# Patient Record
Sex: Male | Born: 1965 | Race: White | Hispanic: No | Marital: Married | Smoking: Never smoker
Health system: Southern US, Community
[De-identification: ages and names within clinical notes are randomized; demographics above are authoritative.]

## PROBLEM LIST (undated history)

## (undated) DIAGNOSIS — Z789 Other specified health status: Secondary | ICD-10-CM

## (undated) HISTORY — PX: CYSTOSCOPY PROSTATE W/ LASER: SUR372

---

## 2010-02-10 ENCOUNTER — Ambulatory Visit: Payer: Self-pay | Admitting: Urology

## 2010-03-07 ENCOUNTER — Ambulatory Visit: Payer: Self-pay | Admitting: Urology

## 2011-08-31 IMAGING — US US PELVIS LIMITED
1 series · 17 of 25 positions shown · non-contrast
Comparison: None

REASON FOR EXAM: Testicular Cyst
COMMENTS:

PROCEDURE:     US  - US TESTICULAR  - February 10, 2010 [DATE]
RESULT:     Indication: Testicular cyst
TECHNIQUE: Multiple gray-scale, color-flow Doppler, and spectral waveform
tracings of the testicles and testicular vasculature are presented for
review.

[Series 1: us pelvis limited · 17 of 32 slices shown]
[im 1/32]
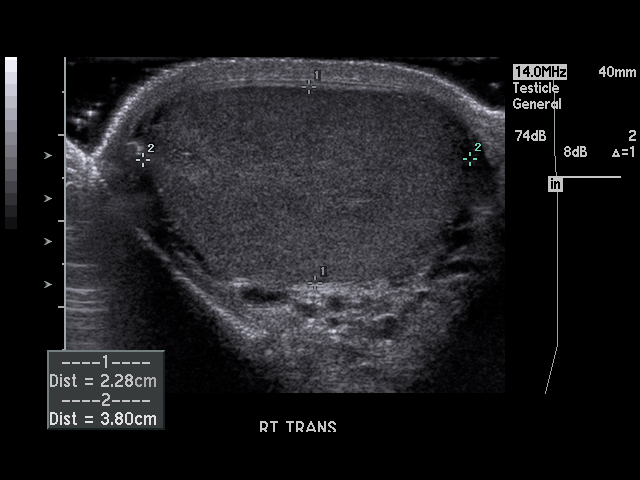
[im 3/32]
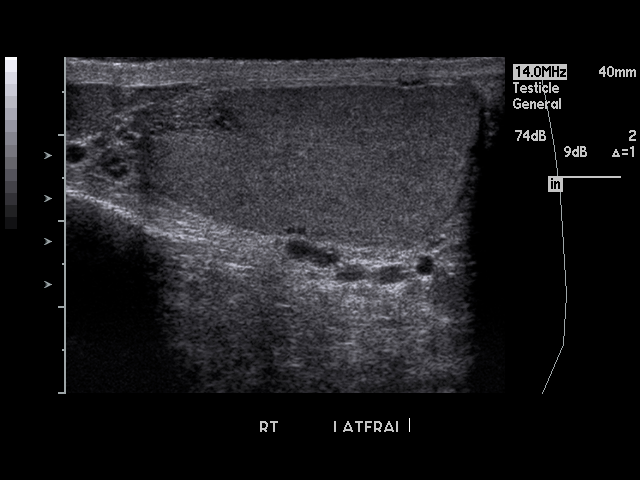
[im 4/32]
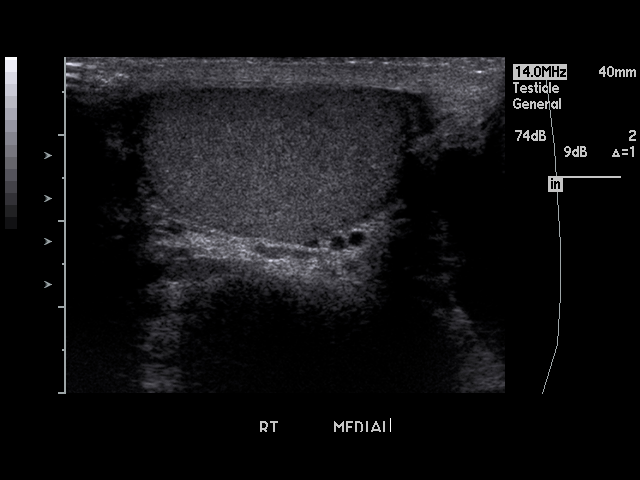
[im 7/32]
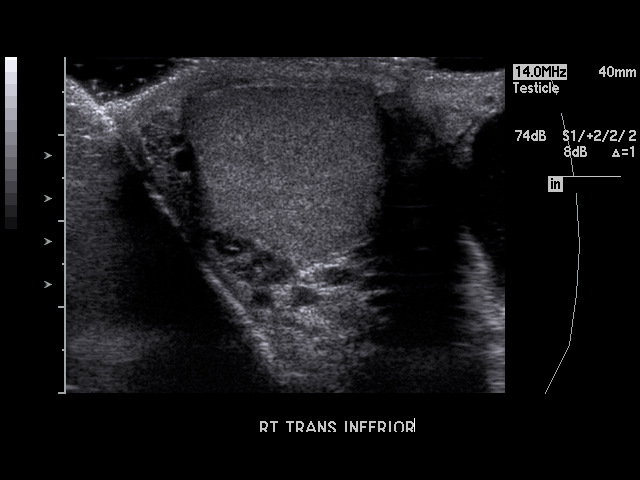
[im 8/32]
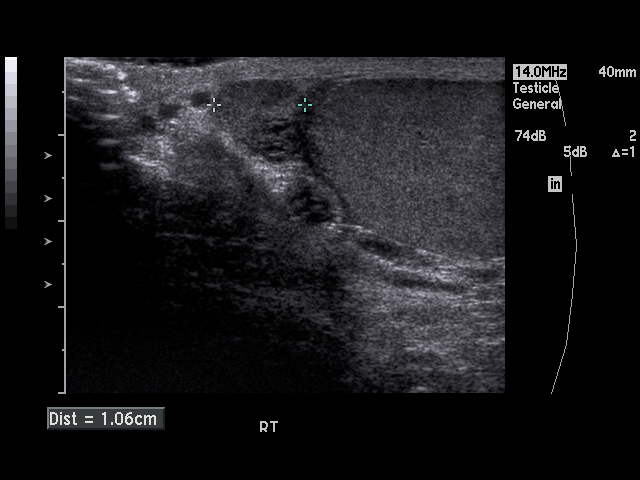
[im 11/32]
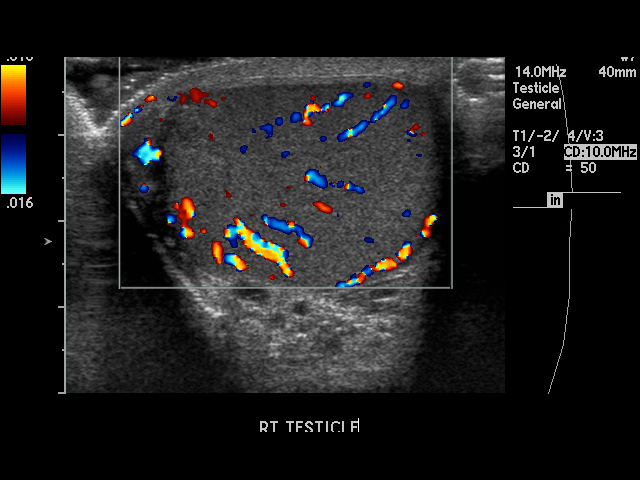
[im 12/32]
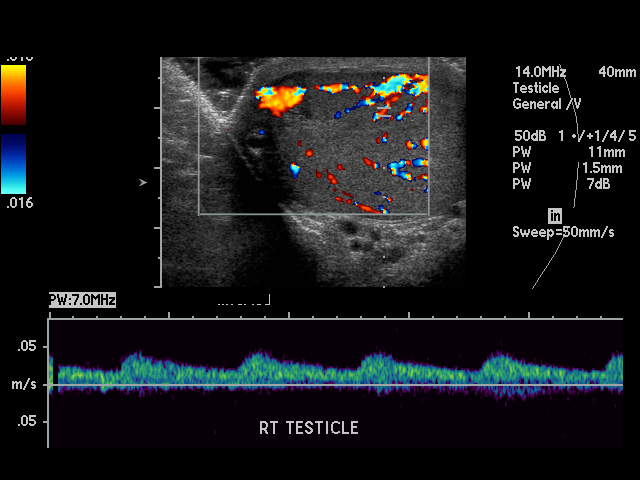
[im 15/32]
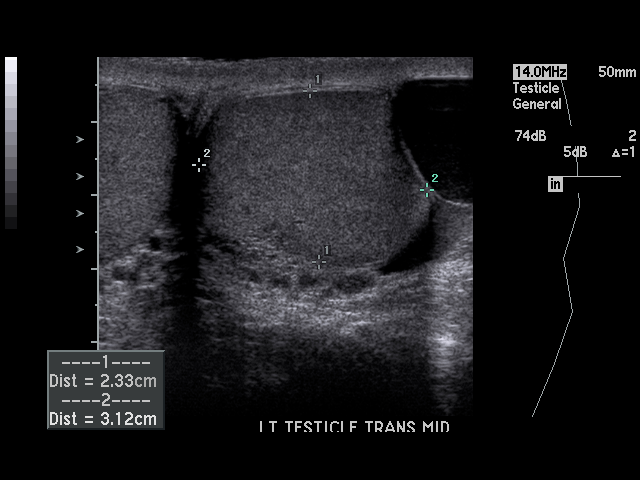
[im 16/32]
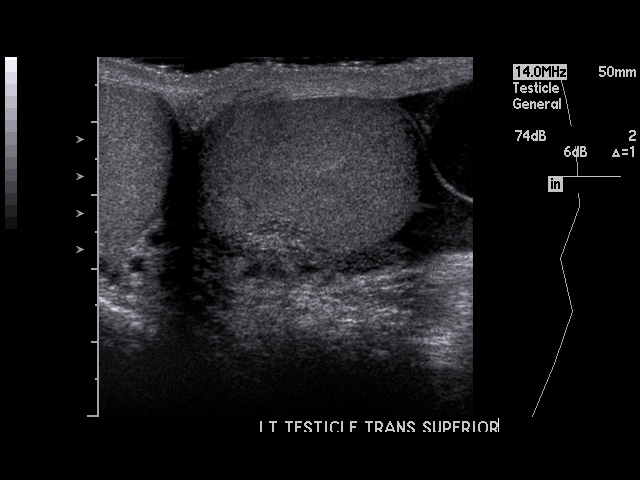
[im 17/32]
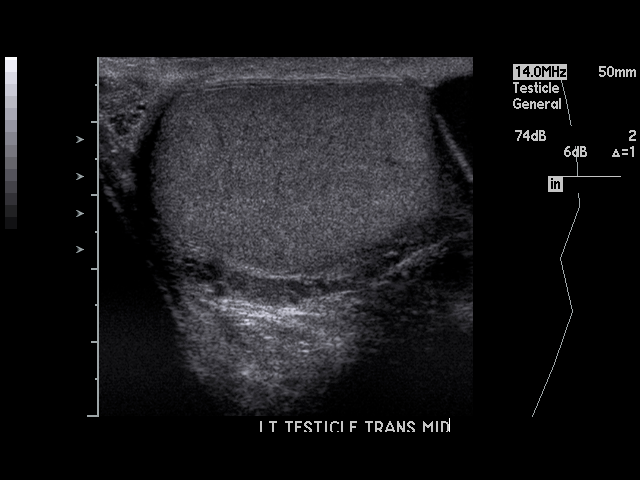
[im 20/32]
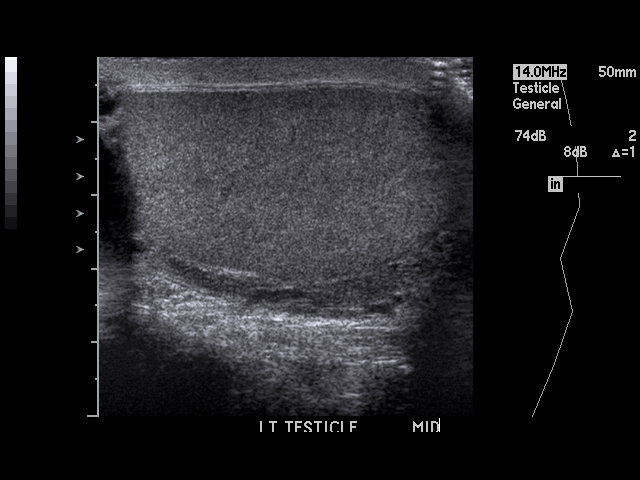
[im 21/32]
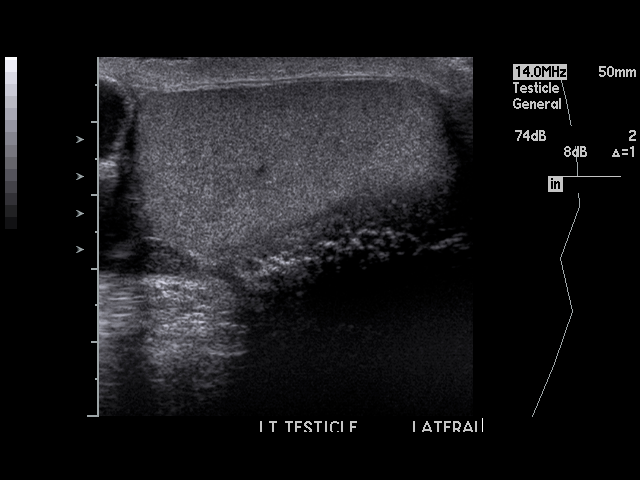
[im 24/32]
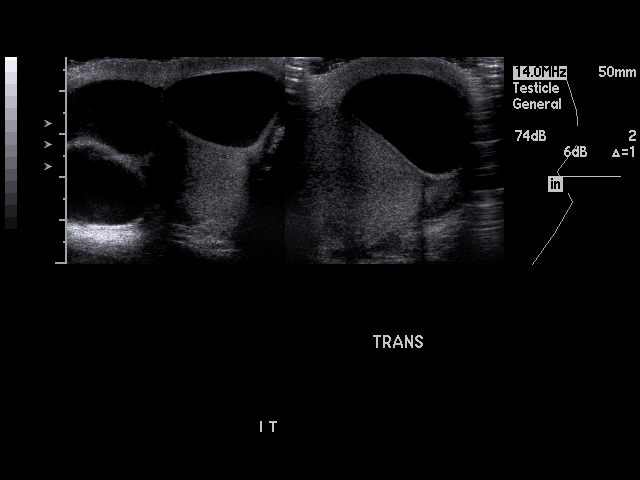
[im 25/32]
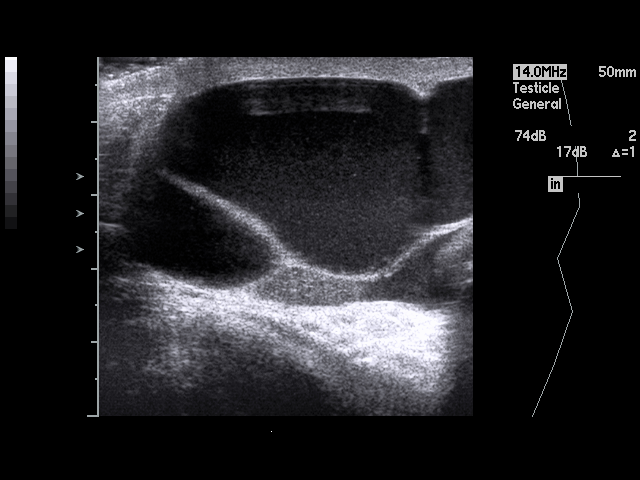
[im 28/32]
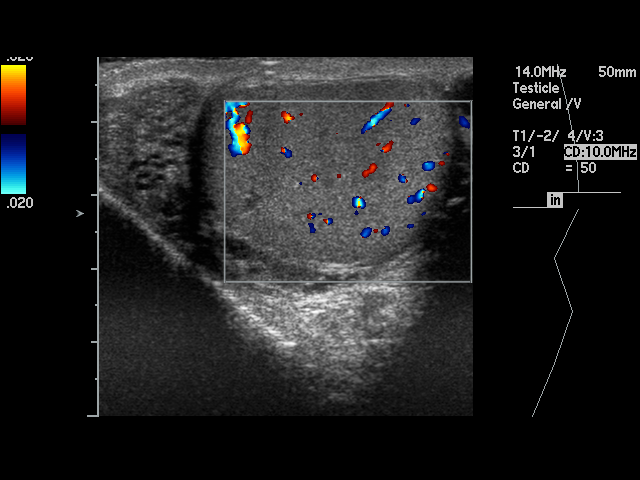
[im 29/32]
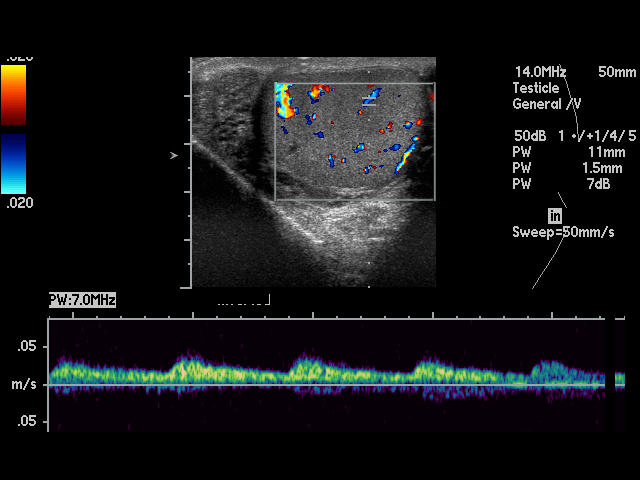
[im 32/32]
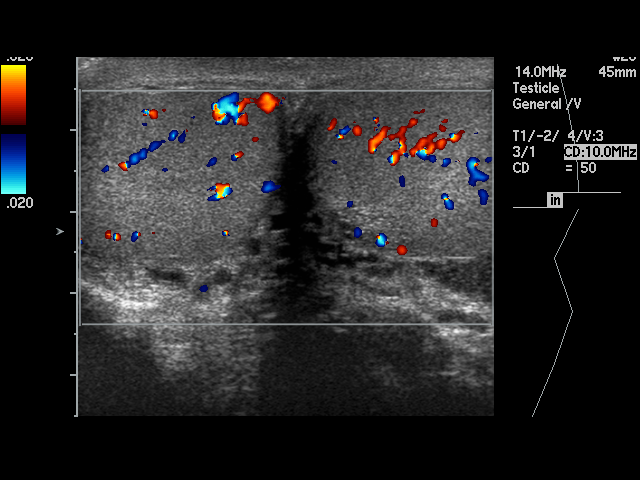

[17 of 25 positions shown; findings below may reference images not displayed]

FINDINGS: The right testicle is of normal echogenicity and measures 2.3 x 3.8 x
cm. No focal mass lesions. There is normal arterial and venous blood flow.
The right epididymal head measures 1.1 cm and is normal in appearance. No
right varicocele or significant hydrocele.

The left testicle is of normal echogenicity and measures 4.3 x 2.3 x 3.1 cm.
No focal mass lesions. There is normal arterial and venous blood flow. There
are 3 anechoic mass in the region of the left epididymis which may represent
epididymal cyst versus a loculated hydrocele. The cysts measure 2.7 x 2.4 x
3 cm, 4.8 x 3.1 x 3.6 cm and 2.6 x 3.8 x 1.9 cm. There are a few internal
echoes within this is likely representing the degree. No left varicocele or
significant hydrocele.
IMPRESSION: 1. There are 3 anechoic mass in the region of the left epididymis which may
represent epididymal cyst versus a loculated hydrocele.

2. There is no evidence of testicular torsion.

## 2017-03-12 ENCOUNTER — Telehealth: Payer: Self-pay | Admitting: Gastroenterology

## 2017-03-12 NOTE — Telephone Encounter (Signed)
Patient needs his colonoscopy done before Dec. 9th.

## 2017-03-14 ENCOUNTER — Telehealth: Payer: Self-pay

## 2017-03-14 ENCOUNTER — Other Ambulatory Visit: Payer: Self-pay

## 2017-03-14 DIAGNOSIS — Z1211 Encounter for screening for malignant neoplasm of colon: Secondary | ICD-10-CM

## 2017-03-14 MED ORDER — NA SULFATE-K SULFATE-MG SULF 17.5-3.13-1.6 GM/177ML PO SOLN: 1.0000 | ORAL | 0 refills | Status: AC

## 2017-03-14 NOTE — Telephone Encounter (Signed)
Pt scheduled for a screening colonoscopy at [facility] with [place] on 03/19/17.

## 2017-03-14 NOTE — Telephone Encounter (Signed)
Gastroenterology Pre-Procedure Review  Request Date:  Requesting Physician: Dr.   PATIENT REVIEW QUESTIONS: The patient responded to the following health history questions as indicated:    1. Are you having any GI issues? no 2. Do you have a personal history of Polyps? no 3. Do you have a family history of Colon Cancer or Polyps? no 4. Diabetes Mellitus? no 5. Joint replacements in the past 12 months?no 6. Major health problems in the past 3 months?no 7. Any artificial heart valves, MVP, or defibrillator?no    MEDICATIONS & ALLERGIES:    Patient reports the following regarding taking any anticoagulation/antiplatelet therapy:   Plavix, Coumadin, Eliquis, Xarelto, Lovenox, Pradaxa, Brilinta, or Effient? no Aspirin? no  Patient confirms/reports the following medications:  No current outpatient medications on file.   No current facility-administered medications for this visit.     Patient confirms/reports the following allergies:  Allergies not on file  No orders of the defined types were placed in this encounter.   AUTHORIZATION INFORMATION Primary Insurance: 1D#: Group #:  Secondary Insurance: 1D#: Group #:  SCHEDULE INFORMATION: Date: 03/19/17 Time: Location: [facility]

## 2017-03-19 ENCOUNTER — Encounter
Admission: RE | Disposition: A | Discharge status: Home / Self Care | Payer: Self-pay | Source: Ambulatory Visit | Attending: Gastroenterology

## 2017-03-19 ENCOUNTER — Ambulatory Visit: Payer: BLUE CROSS/BLUE SHIELD | Admitting: Certified Registered Nurse Anesthetist

## 2017-03-19 ENCOUNTER — Ambulatory Visit
Admission: RE | Admit: 2017-03-19 | Discharge: 2017-03-19 | Disposition: A | Discharge status: Home / Self Care | Payer: BLUE CROSS/BLUE SHIELD | Source: Ambulatory Visit | Attending: Gastroenterology | Admitting: Gastroenterology

## 2017-03-19 ENCOUNTER — Encounter: Payer: Self-pay | Admitting: *Deleted

## 2017-03-19 DIAGNOSIS — D125 Benign neoplasm of sigmoid colon: Secondary | ICD-10-CM | POA: Diagnosis not present

## 2017-03-19 DIAGNOSIS — K635 Polyp of colon: Secondary | ICD-10-CM

## 2017-03-19 DIAGNOSIS — K640 First degree hemorrhoids: Secondary | ICD-10-CM | POA: Diagnosis not present

## 2017-03-19 DIAGNOSIS — Z1211 Encounter for screening for malignant neoplasm of colon: Secondary | ICD-10-CM | POA: Diagnosis present

## 2017-03-19 HISTORY — DX: Other specified health status: Z78.9

## 2017-03-19 HISTORY — PX: COLONOSCOPY WITH PROPOFOL: SHX5780

## 2017-03-19 SURGERY — COLONOSCOPY WITH PROPOFOL
Anesthesia: General

## 2017-03-19 MED ORDER — PROPOFOL 500 MG/50ML IV EMUL
INTRAVENOUS | Status: DC | PRN
  Administered 2017-03-19: 150 ug/kg/min via INTRAVENOUS

## 2017-03-19 MED ORDER — LIDOCAINE HCL (PF) 2 % IJ SOLN
INTRAMUSCULAR | Status: AC
  Filled 2017-03-19: qty 10

## 2017-03-19 MED ORDER — SODIUM CHLORIDE 0.9 % IV SOLN
INTRAVENOUS | Status: DC
  Administered 2017-03-19: 1000 mL via INTRAVENOUS

## 2017-03-19 MED ORDER — LIDOCAINE HCL (CARDIAC) 20 MG/ML IV SOLN
INTRAVENOUS | Status: DC | PRN
  Administered 2017-03-19: 50 mg via INTRAVENOUS

## 2017-03-19 MED ORDER — PROPOFOL 10 MG/ML IV BOLUS
INTRAVENOUS | Status: DC | PRN
  Administered 2017-03-19 (×2): 40 mg via INTRAVENOUS
  Administered 2017-03-19: 50 mg via INTRAVENOUS

## 2017-03-19 MED ORDER — PROPOFOL 500 MG/50ML IV EMUL
INTRAVENOUS | Status: AC
  Filled 2017-03-19: qty 50

## 2017-03-19 NOTE — Anesthesia Procedure Notes (Signed)
Performed by: Johnna Acosta, CRNA Pre-anesthesia Checklist: Patient identified, Emergency Drugs available, Patient being monitored and Timeout performed Patient Re-evaluated:Patient Re-evaluated prior to induction Oxygen Delivery Method: Nasal cannula

## 2017-03-19 NOTE — H&P (Signed)
   Lucilla Lame, MD [address] [place], [place] [address] Phone: [phone] Fax : [phone]  Primary Care Physician:  Lynnell Jude, MD Primary Gastroenterologist:  Dr. Allen Norris  Pre-Procedure History & Physical: HPI:  Kurt Bell is a 51 y.o. male is here for a screening colonoscopy.   Past Medical History:  Diagnosis Date  . Medical history non-contributory     Past Surgical History:  Procedure Laterality Date  . CYSTOSCOPY PROSTATE W/ LASER      Prior to Admission medications   Medication Sig Start Date End Date Taking? Authorizing Provider  Na Sulfate-K Sulfate-Mg Sulf (SUPREP BOWEL PREP KIT) 17.5-3.13-1.6 GM/177ML SOLN Take 1 kit as directed by mouth. 03/14/17   Lucilla Lame, MD    Allergies as of 03/15/2017  . (No Known Allergies)    History reviewed. No pertinent family history.  Social History   Socioeconomic History  . Marital status: Married    Spouse name: Not on file  . Number of children: Not on file  . Years of education: Not on file  . Highest education level: Not on file  Social Needs  . Financial resource strain: Not on file  . Food insecurity - worry: Not on file  . Food insecurity - inability: Not on file  . Transportation needs - medical: Not on file  . Transportation needs - non-medical: Not on file  Occupational History  . Not on file  Tobacco Use  . Smoking status: Never Smoker  . Smokeless tobacco: Never Used  Substance and Sexual Activity  . Alcohol use: Not on file  . Drug use: Not on file  . Sexual activity: Not on file  Other Topics Concern  . Not on file  Social History Narrative  . Not on file    Review of Systems: See HPI, otherwise negative ROS  Physical Exam: BP 115/75   Pulse 72   Temp (!) 97.4 F (36.3 C) (Tympanic)   Resp 18   Ht 6' (1.829 m)   Wt 159 lb (72.1 kg)   SpO2 100%   BMI 21.56 kg/m  General:   Alert,  pleasant and cooperative in NAD Head:  Normocephalic and  atraumatic. Neck:  Supple; no masses or thyromegaly. Lungs:  Clear throughout to auscultation.    Heart:  Regular rate and rhythm. Abdomen:  Soft, nontender and nondistended. Normal bowel sounds, without guarding, and without rebound.   Neurologic:  Alert and  oriented x4;  grossly normal neurologically.  Impression/Plan: Kurt Bell is now here to undergo a screening colonoscopy.  Risks, benefits, and alternatives regarding colonoscopy have been reviewed with the patient.  Questions have been answered.  All parties agreeable.

## 2017-03-19 NOTE — Transfer of Care (Signed)
Immediate Anesthesia Transfer of Care Note  Patient: Kurt Bell  Procedure(s) Performed: COLONOSCOPY WITH PROPOFOL (N/A )  Patient Location: PACU  Anesthesia Type:General  Level of Consciousness: sedated  Airway & Oxygen Therapy: Patient Spontanous Breathing and Patient connected to nasal cannula oxygen  Post-op Assessment: Report given to RN and Post -op Vital signs reviewed and stable  Post vital signs: Reviewed and stable  Last Vitals:  Vitals:   03/19/17 0917  BP: 115/75  Pulse: 72  Resp: 18  Temp: (!) 36.3 C  SpO2: 100%    Last Pain:  Vitals:   03/19/17 0917  TempSrc: Tympanic         Complications: No apparent anesthesia complications

## 2017-03-19 NOTE — Anesthesia Post-op Follow-up Note (Signed)
Anesthesia QCDR form completed.        

## 2017-03-19 NOTE — Op Note (Signed)
[facility] Gastroenterology Patient Name: Kurt Bell Procedure Date: 03/19/2017 9:39 AM MRN: [id number] Account #: [id number] Date of Birth: 1965/07/18 Admit Type: Outpatient Age: 51 Room: [facility] ENDO ROOM 4 Gender: Male Note Status: Finalized Procedure:            Colonoscopy Indications:          Screening for colorectal malignant neoplasm Providers:            Lucilla Lame MD, MD Referring MD:         Lynnell Jude (Referring MD) Medicines:            Propofol per Anesthesia Complications:        No immediate complications. Procedure:            Pre-Anesthesia Assessment:                       - Prior to the procedure, a History and Physical was                        performed, and patient medications and allergies were                        reviewed. The patient's tolerance of previous                        anesthesia was also reviewed. The risks and benefits of                        the procedure and the sedation options and risks were                        discussed with the patient. All questions were                        answered, and informed consent was obtained. Prior                        Anticoagulants: The patient has taken no previous                        anticoagulant or antiplatelet agents. ASA Grade                        Assessment: II - A patient with mild systemic disease.                        After reviewing the risks and benefits, the patient was                        deemed in satisfactory condition to undergo the                        procedure.                       After obtaining informed consent, the colonoscope was                        passed under direct vision. Throughout the procedure,  the patient's blood pressure, pulse, and oxygen                        saturations were monitored continuously. The                        Colonoscope was introduced through the anus and                         advanced to the the cecum, identified by appendiceal                        orifice and ileocecal valve. The colonoscopy was                        performed without difficulty. The patient tolerated the                        procedure well. The quality of the bowel preparation                        was excellent. Findings:      The perianal and digital rectal examinations were normal.      A 2 mm polyp was found in the sigmoid colon. The polyp was sessile. The       polyp was removed with a cold biopsy forceps. Resection and retrieval       were complete.      Non-bleeding internal hemorrhoids were found during retroflexion. The       hemorrhoids were Grade I (internal hemorrhoids that do not prolapse). Impression:           - One 2 mm polyp in the sigmoid colon, removed with a                        cold biopsy forceps. Resected and retrieved.                       - Non-bleeding internal hemorrhoids. Recommendation:       - Discharge patient to home.                       - Resume previous diet.                       - Continue present medications.                       - Await pathology results.                       - Repeat colonoscopy in 5 years if polyp adenoma and 10                        years if hyperplastic Procedure Code(s):    --- Professional ---                       [phone], Colonoscopy, flexible; with biopsy, single or                        multiple Diagnosis Code(s):    --- Professional ---  Z12.11, Encounter for screening for malignant neoplasm                        of colon                       D12.5, Benign neoplasm of sigmoid colon CPT copyright 2016 American Medical Association. All rights reserved. The codes documented in this report are preliminary and upon coder review may  be revised to meet current compliance requirements. Lucilla Lame MD, MD 03/19/2017 9:58:46 AM This report has been signed electronically. Number of Addenda:  0 Note Initiated On: 03/19/2017 9:39 AM Scope Withdrawal Time: 0 hours 7 minutes 40 seconds  Total Procedure Duration: 0 hours 11 minutes 9 seconds       [place]

## 2017-03-19 NOTE — Anesthesia Preprocedure Evaluation (Signed)
Anesthesia Evaluation  Patient identified by MRN, date of birth, ID band Patient awake    Reviewed: Allergy & Precautions, H&P , NPO status , Patient's Chart, lab work & pertinent test results, reviewed documented beta blocker date and time   Airway Mallampati: II   Neck ROM: full    Dental  (+) Poor Dentition   Pulmonary neg pulmonary ROS,    Pulmonary exam normal        Cardiovascular Exercise Tolerance: Good negative cardio ROS Normal cardiovascular exam Rhythm:regular Rate:Normal     Neuro/Psych negative neurological ROS  negative psych ROS   GI/Hepatic negative GI ROS, Neg liver ROS,   Endo/Other  negative endocrine ROS  Renal/GU negative Renal ROS  negative genitourinary   Musculoskeletal   Abdominal   Peds  Hematology negative hematology ROS (+)   Anesthesia Other Findings Past Medical History: No date: Medical history non-contributory Past Surgical History: No date: CYSTOSCOPY PROSTATE W/ LASER BMI    Body Mass Index:  21.56 kg/m     Reproductive/Obstetrics negative OB ROS                             Anesthesia Physical Anesthesia Plan  ASA: I  Anesthesia Plan: General   Post-op Pain Management:    Induction:   PONV Risk Score and Plan:   Airway Management Planned:   Additional Equipment:   Intra-op Plan:   Post-operative Plan:   Informed Consent: I have reviewed the patients History and Physical, chart, labs and discussed the procedure including the risks, benefits and alternatives for the proposed anesthesia with the patient or authorized representative who has indicated his/her understanding and acceptance.   Dental Advisory Given  Plan Discussed with: CRNA  Anesthesia Plan Comments:         Anesthesia Quick Evaluation

## 2017-03-20 ENCOUNTER — Encounter: Payer: Self-pay | Admitting: Gastroenterology

## 2017-03-20 LAB — SURGICAL PATHOLOGY

## 2017-03-20 NOTE — Anesthesia Postprocedure Evaluation (Signed)
Anesthesia Post Note  Patient: Kurt Bell  Procedure(s) Performed: COLONOSCOPY WITH PROPOFOL (N/A )  Patient location during evaluation: PACU Anesthesia Type: General Level of consciousness: awake and alert Pain management: pain level controlled Vital Signs Assessment: post-procedure vital signs reviewed and stable Respiratory status: spontaneous breathing, nonlabored ventilation, respiratory function stable and patient connected to nasal cannula oxygen Cardiovascular status: blood pressure returned to baseline and stable Postop Assessment: no apparent nausea or vomiting Anesthetic complications: no     Last Vitals:  Vitals:   03/19/17 1033 03/19/17 1043  BP: 116/78 120/85  Pulse: (!) 50   Resp: 11   Temp:    SpO2: 100%     Last Pain:  Vitals:   03/19/17 1003  TempSrc: Tympanic                 Molli Barrows

## 2017-03-24 ENCOUNTER — Encounter: Payer: Self-pay | Admitting: Gastroenterology

## 2020-11-04 ENCOUNTER — Other Ambulatory Visit: Payer: Self-pay | Admitting: Adult Health

## 2020-11-04 DIAGNOSIS — U071 COVID-19: Secondary | ICD-10-CM

## 2020-11-04 MED ORDER — MOLNUPIRAVIR EUA 200MG CAPSULE: 4.0000 | ORAL_CAPSULE | Freq: Two times a day (BID) | ORAL | 0 refills | Status: AC

## 2020-11-04 NOTE — Progress Notes (Signed)
Outpatient Oral COVID Treatment Note  I connected with Kurt Bell on 11/04/2020/5:09 PM by telephone and verified that I am speaking with the correct person using two identifiers.  I discussed the limitations, risks, security, and privacy concerns of performing an evaluation and management service by telephone and the availability of in person appointments. I also discussed with the patient that there may be a patient responsible charge related to this service. The patient expressed understanding and agreed to proceed.  Patient location: home Provider location: home  Diagnosis: COVID-19 infection  Purpose of visit: Discussion of potential use of Molnupiravir or Paxlovid, a new treatment for mild to moderate COVID-19 viral infection in non-hospitalized patients.   Subjective: Patient is a 55 y.o. male who has been diagnosed with COVID 19 viral infection.  Their symptoms began on 11/01/2020 with sore throat and congestion.    Past Medical History:  Diagnosis Date   Medical history non-contributory     No Known Allergies   Current Outpatient Medications:    Na Sulfate-K Sulfate-Mg Sulf (SUPREP BOWEL PREP KIT) 17.5-3.13-1.6 GM/177ML SOLN, Take 1 kit as directed by mouth., Disp: 1 Bottle, Rfl: 0  Objective: Patient sounds tired and congested.  They are in no apparent distress.  Breathing is non labored.  Mood and behavior are normal.  Laboratory Data:  No results found for this or any previous visit (from the past 2160 hour(s)).   Assessment: 55 y.o. male with mild/moderate COVID 19 viral infection diagnosed on 11/02/2020 at high risk for progression to severe COVID 19.  Plan:  This patient is a 55 y.o. male that meets the following criteria for Emergency Use Authorization of: Molnupiravir  Age >18 yr SARS-COV-2 positive test Symptom onset < 5 days Mild-to-moderate COVID disease with high risk for severe progression to hospitalization or death  I have spoken and communicated the  following to the patient or parent/caregiver regarding: Molnupiravir is an unapproved drug that is authorized for use under an Emergency Use Authorization.  There are no adequate, approved, available products for the treatment of COVID-19 in adults who have mild-to-moderate COVID-19 and are at high risk for progressing to severe COVID-19, including hospitalization or death. Other therapeutics are currently authorized. For additional information on all products authorized for treatment or prevention of COVID-19, please see [employer].  There are benefits and risks of taking this treatment as outlined in the "Fact Sheet for Patients and Caregivers."  "Fact Sheet for Patients and Caregivers" was reviewed with patient. A hard copy will be provided to patient from pharmacy prior to the patient receiving treatment. Patients should continue to self-isolate and use infection control measures (e.g., wear mask, isolate, social distance, avoid sharing personal items, clean and disinfect "high touch" surfaces, and frequent handwashing) according to CDC guidelines.  The patient or parent/caregiver has the option to accept or refuse treatment. [place] has established a pregnancy surveillance program. Females of childbearing potential should use a reliable method of contraception correctly and consistently, as applicable, for the duration of treatment and for 4 days after the last dose of Molnupiravir. Males of reproductive potential who are sexually active with females of childbearing potential should use a reliable method of contraception correctly and consistently during treatment and for at least 3 months after the last dose. Pregnancy status and risk was assessed. Patient verbalized understanding of precautions.  After reviewing above information with the patient, the patient agrees to  receive molnupiravir.  Follow up instructions:    Take prescription  BID x 5 days as directed Reach out to pharmacist for counseling on medication if desired For concerns regarding further COVID symptoms please follow up with your PCP or urgent care For urgent or life-threatening issues, seek care at your local emergency department  The patient was provided an opportunity to ask questions, and all were answered. The patient agreed with the plan and demonstrated an understanding of the instructions.   Script sent to [facility] on [address].    The patient was advised to call their PCP or seek an in-person evaluation if the symptoms worsen or if the condition fails to improve as anticipated.   I provided 25 minutes of non face-to-face telephone visit time during this encounter, and > 50% was spent counseling as documented under my assessment & plan.  Scot Dock, NP 11/04/2020 /5:09 PM

## 2024-03-19 ENCOUNTER — Other Ambulatory Visit: Payer: Self-pay | Admitting: Internal Medicine

## 2024-03-19 DIAGNOSIS — Z8249 Family history of ischemic heart disease and other diseases of the circulatory system: Secondary | ICD-10-CM

## 2024-03-19 DIAGNOSIS — Z7689 Persons encountering health services in other specified circumstances: Secondary | ICD-10-CM

## 2024-04-09 ENCOUNTER — Other Ambulatory Visit: Payer: Self-pay | Admitting: Internal Medicine

## 2024-04-09 DIAGNOSIS — Z8249 Family history of ischemic heart disease and other diseases of the circulatory system: Secondary | ICD-10-CM

## 2024-04-09 DIAGNOSIS — Z7689 Persons encountering health services in other specified circumstances: Secondary | ICD-10-CM
